# Patient Record
Sex: Male | Born: 1985 | Race: White | Hispanic: No | Marital: Single | Smoking: Never smoker
Health system: Southern US, Community
[De-identification: ages and names within clinical notes are randomized; demographics above are authoritative.]

## PROBLEM LIST (undated history)

## (undated) DIAGNOSIS — Z8719 Personal history of other diseases of the digestive system: Secondary | ICD-10-CM

## (undated) DIAGNOSIS — K5190 Ulcerative colitis, unspecified, without complications: Secondary | ICD-10-CM

## (undated) DIAGNOSIS — K602 Anal fissure, unspecified: Secondary | ICD-10-CM

## (undated) DIAGNOSIS — E119 Type 2 diabetes mellitus without complications: Secondary | ICD-10-CM

## (undated) DIAGNOSIS — F79 Unspecified intellectual disabilities: Secondary | ICD-10-CM

## (undated) DIAGNOSIS — G40909 Epilepsy, unspecified, not intractable, without status epilepticus: Secondary | ICD-10-CM

## (undated) DIAGNOSIS — G809 Cerebral palsy, unspecified: Secondary | ICD-10-CM

## (undated) DIAGNOSIS — K649 Unspecified hemorrhoids: Secondary | ICD-10-CM

## (undated) DIAGNOSIS — R51 Headache: Secondary | ICD-10-CM

## (undated) HISTORY — DX: Unspecified hemorrhoids: K64.9

## (undated) HISTORY — DX: Anal fissure, unspecified: K60.2

## (undated) HISTORY — DX: Personal history of other diseases of the digestive system: Z87.19

## (undated) HISTORY — PX: APPENDECTOMY: SHX54

## (undated) HISTORY — DX: Epilepsy, unspecified, not intractable, without status epilepticus: G40.909

## (undated) HISTORY — DX: Cerebral palsy, unspecified: G80.9

## (undated) HISTORY — DX: Type 2 diabetes mellitus without complications: E11.9

## (undated) HISTORY — DX: Unspecified intellectual disabilities: F79

## (undated) HISTORY — PX: TYMPANOSTOMY TUBE PLACEMENT: SHX32

## (undated) SURGICAL SUPPLY — 20 items
BLOCK BITE 60FR ADLT L/F BLUE (MISCELLANEOUS) ×2
ELECT REM PT RETURN 9FT ADLT (ELECTROSURGICAL)
FCP BXJMBJMB 240X2.8X (CUTTING FORCEPS)
FLOOR PAD 36X40 (MISCELLANEOUS) ×3
FORCEP RJ3 GP 1.8X160 W-NEEDLE (CUTTING FORCEPS)
FORCEPS BIOP RAD 4 LRG CAP 4 (CUTTING FORCEPS)
FORCEPS BIOP RJ4 240 W/NDL (CUTTING FORCEPS)
INJECTOR/SNARE I SNARE (MISCELLANEOUS)
LUBRICANT JELLY 4.5OZ STERILE (MISCELLANEOUS)
MANIFOLD NEPTUNE II (INSTRUMENTS)
NEEDLE SCLEROTHERAPY 25GX240 (NEEDLE)
PROBE APC STR FIRE (PROBE)
PROBE INJECTION GOLD (MISCELLANEOUS)
SNARE ROTATE MED OVAL 20MM (MISCELLANEOUS)
SNARE SHORT THROW 13M SML OVAL (MISCELLANEOUS)
SYR 50ML LL SCALE MARK (SYRINGE)
TRAP SPECIMEN MUCOUS 40CC (MISCELLANEOUS)
TUBING ENDO SMARTCAP PENTAX (MISCELLANEOUS) ×4
TUBING IRRIGATION ENDOGATOR (MISCELLANEOUS) ×2
WATER STERILE IRR 1000ML POUR (IV SOLUTION)

---

## 2011-03-01 VITALS — BP 118/80 | HR 76 | Temp 98.2°F | Resp 16 | Ht 71.0 in | Wt 163.4 lb

## 2011-03-01 DIAGNOSIS — K602 Anal fissure, unspecified: Secondary | ICD-10-CM

## 2011-03-01 MED ORDER — NONFORMULARY OR COMPOUNDED ITEM: Status: DC

## 2011-04-11 VITALS — BP 108/78 | HR 76 | Temp 97.2°F | Resp 16 | Ht 71.0 in | Wt 164.6 lb

## 2011-04-11 DIAGNOSIS — K602 Anal fissure, unspecified: Secondary | ICD-10-CM

## 2011-04-11 MED ORDER — NONFORMULARY OR COMPOUNDED ITEM: Status: DC

## 2011-05-09 VITALS — BP 116/60 | HR 58 | Temp 97.8°F | Ht 70.0 in | Wt 166.8 lb

## 2011-05-09 DIAGNOSIS — K602 Anal fissure, unspecified: Secondary | ICD-10-CM

## 2012-05-14 DIAGNOSIS — H4600 Optic papillitis, unspecified eye: Secondary | ICD-10-CM

## 2012-05-14 DIAGNOSIS — H547 Unspecified visual loss: Secondary | ICD-10-CM

## 2012-05-19 DIAGNOSIS — H4600 Optic papillitis, unspecified eye: Secondary | ICD-10-CM

## 2012-05-19 DIAGNOSIS — H547 Unspecified visual loss: Secondary | ICD-10-CM

## 2012-05-20 DIAGNOSIS — H469 Unspecified optic neuritis: Secondary | ICD-10-CM

## 2012-05-20 DIAGNOSIS — R202 Paresthesia of skin: Secondary | ICD-10-CM

## 2012-05-20 DIAGNOSIS — R200 Anesthesia of skin: Secondary | ICD-10-CM

## 2012-05-21 HISTORY — DX: Headache: R51

## 2012-05-22 DIAGNOSIS — H469 Unspecified optic neuritis: Secondary | ICD-10-CM | POA: Insufficient documentation

## 2012-05-22 DIAGNOSIS — J3489 Other specified disorders of nose and nasal sinuses: Secondary | ICD-10-CM | POA: Insufficient documentation

## 2012-05-22 DIAGNOSIS — I6990 Unspecified sequelae of unspecified cerebrovascular disease: Secondary | ICD-10-CM | POA: Insufficient documentation

## 2012-05-22 DIAGNOSIS — R209 Unspecified disturbances of skin sensation: Secondary | ICD-10-CM | POA: Insufficient documentation

## 2012-05-22 DIAGNOSIS — R569 Unspecified convulsions: Secondary | ICD-10-CM | POA: Insufficient documentation

## 2012-05-22 DIAGNOSIS — R200 Anesthesia of skin: Secondary | ICD-10-CM

## 2012-05-22 DIAGNOSIS — R202 Paresthesia of skin: Secondary | ICD-10-CM

## 2012-05-22 HISTORY — PX: RADIOLOGY WITH ANESTHESIA: SHX6223

## 2012-05-22 MED ORDER — LACTATED RINGERS IV SOLN
INTRAVENOUS | Status: DC | PRN
  Administered 2012-05-22: 15:00:00 via INTRAVENOUS

## 2012-05-22 MED ORDER — LACTATED RINGERS IV SOLN
INTRAVENOUS | Status: DC
  Administered 2012-05-22: 12:00:00 via INTRAVENOUS

## 2012-05-22 MED ORDER — GADOBENATE DIMEGLUMINE 529 MG/ML IV SOLN
20.0000 mL | Freq: Once | INTRAVENOUS | Status: AC
  Administered 2012-05-22: 17 mL via INTRAVENOUS

## 2012-05-28 DIAGNOSIS — H469 Unspecified optic neuritis: Secondary | ICD-10-CM | POA: Insufficient documentation

## 2012-05-28 MED ORDER — SODIUM CHLORIDE 0.9 % IV SOLN: INTRAVENOUS | Status: DC

## 2012-05-28 MED ORDER — SODIUM CHLORIDE 0.9 % IV SOLN
1000.0000 mg | INTRAVENOUS | Status: DC
  Administered 2012-05-28: 1000 mg via INTRAVENOUS

## 2012-05-28 MED ORDER — FAMOTIDINE 20 MG PO TABS
40.0000 mg | ORAL_TABLET | Freq: Every day | ORAL | Status: DC
  Administered 2012-05-28: 40 mg via ORAL

## 2012-05-28 MED ORDER — METHYLPREDNISOLONE SODIUM SUCC 125 MG IJ SOLR: 1000.0000 mg | INTRAMUSCULAR | Status: DC

## 2012-05-28 MED ORDER — SODIUM CHLORIDE 0.9 % IV SOLN: 1000.0000 mg | INTRAVENOUS | Status: AC

## 2012-05-29 MED ORDER — FAMOTIDINE 20 MG PO TABS
40.0000 mg | ORAL_TABLET | Freq: Every day | ORAL | Status: DC
  Administered 2012-05-29: 40 mg via ORAL

## 2012-05-29 MED ORDER — SODIUM CHLORIDE 0.9 % IV SOLN
1000.0000 mg | INTRAVENOUS | Status: DC
  Administered 2012-05-29: 1000 mg via INTRAVENOUS

## 2012-05-29 MED ORDER — SODIUM CHLORIDE 0.9 % IV SOLN
INTRAVENOUS | Status: DC
  Administered 2012-05-29: 10:00:00 via INTRAVENOUS

## 2012-05-30 MED ORDER — SODIUM CHLORIDE 0.9 % IV SOLN: INTRAVENOUS | Status: DC

## 2012-05-30 MED ORDER — FAMOTIDINE 20 MG PO TABS
40.0000 mg | ORAL_TABLET | Freq: Every day | ORAL | Status: AC
  Administered 2012-05-30: 40 mg via ORAL

## 2012-05-30 MED ORDER — SODIUM CHLORIDE 0.9 % IV SOLN
1000.0000 mg | INTRAVENOUS | Status: AC
  Administered 2012-05-30: 1000 mg via INTRAVENOUS

## 2012-07-18 DIAGNOSIS — H5010 Unspecified exotropia: Secondary | ICD-10-CM | POA: Insufficient documentation

## 2012-07-18 DIAGNOSIS — H53009 Unspecified amblyopia, unspecified eye: Secondary | ICD-10-CM | POA: Insufficient documentation

## 2012-08-05 DIAGNOSIS — M545 Low back pain, unspecified: Secondary | ICD-10-CM | POA: Insufficient documentation

## 2012-08-05 DIAGNOSIS — R209 Unspecified disturbances of skin sensation: Secondary | ICD-10-CM | POA: Insufficient documentation

## 2012-08-05 DIAGNOSIS — IMO0001 Reserved for inherently not codable concepts without codable children: Secondary | ICD-10-CM | POA: Insufficient documentation

## 2012-09-30 DIAGNOSIS — M545 Low back pain, unspecified: Secondary | ICD-10-CM | POA: Insufficient documentation

## 2012-09-30 DIAGNOSIS — R209 Unspecified disturbances of skin sensation: Secondary | ICD-10-CM | POA: Insufficient documentation

## 2012-09-30 DIAGNOSIS — IMO0001 Reserved for inherently not codable concepts without codable children: Secondary | ICD-10-CM | POA: Insufficient documentation

## 2012-10-01 DIAGNOSIS — G40309 Generalized idiopathic epilepsy and epileptic syndromes, not intractable, without status epilepticus: Secondary | ICD-10-CM

## 2012-10-01 MED ORDER — DIVALPROEX SODIUM 125 MG PO DR TAB: DELAYED_RELEASE_TABLET | ORAL | Status: DC

## 2012-10-16 DIAGNOSIS — R51 Headache: Secondary | ICD-10-CM | POA: Insufficient documentation

## 2012-10-16 DIAGNOSIS — H469 Unspecified optic neuritis: Secondary | ICD-10-CM | POA: Insufficient documentation

## 2012-10-16 DIAGNOSIS — R209 Unspecified disturbances of skin sensation: Secondary | ICD-10-CM | POA: Insufficient documentation

## 2012-10-16 DIAGNOSIS — M545 Low back pain: Secondary | ICD-10-CM

## 2012-10-16 DIAGNOSIS — R519 Headache, unspecified: Secondary | ICD-10-CM | POA: Insufficient documentation

## 2012-10-16 DIAGNOSIS — F70 Mild intellectual disabilities: Secondary | ICD-10-CM

## 2012-10-16 DIAGNOSIS — G40309 Generalized idiopathic epilepsy and epileptic syndromes, not intractable, without status epilepticus: Secondary | ICD-10-CM | POA: Insufficient documentation

## 2012-10-21 VITALS — BP 110/70 | HR 72 | Ht 69.75 in | Wt 180.2 lb

## 2012-10-21 DIAGNOSIS — M545 Low back pain, unspecified: Secondary | ICD-10-CM

## 2012-10-21 DIAGNOSIS — H469 Unspecified optic neuritis: Secondary | ICD-10-CM

## 2012-10-21 DIAGNOSIS — G40309 Generalized idiopathic epilepsy and epileptic syndromes, not intractable, without status epilepticus: Secondary | ICD-10-CM

## 2012-10-21 DIAGNOSIS — F341 Dysthymic disorder: Secondary | ICD-10-CM

## 2012-10-21 DIAGNOSIS — F70 Mild intellectual disabilities: Secondary | ICD-10-CM

## 2012-10-21 MED ORDER — DEPAKOTE 125 MG PO TBEC: DELAYED_RELEASE_TABLET | ORAL | Status: DC

## 2013-09-04 VITALS — BP 120/68 | HR 84 | Ht 70.0 in | Wt 192.2 lb

## 2013-09-04 DIAGNOSIS — M79609 Pain in unspecified limb: Secondary | ICD-10-CM

## 2013-09-04 DIAGNOSIS — G40309 Generalized idiopathic epilepsy and epileptic syndromes, not intractable, without status epilepticus: Secondary | ICD-10-CM

## 2013-09-04 DIAGNOSIS — M79603 Pain in arm, unspecified: Secondary | ICD-10-CM

## 2013-09-04 DIAGNOSIS — G44219 Episodic tension-type headache, not intractable: Secondary | ICD-10-CM

## 2013-09-04 DIAGNOSIS — F70 Mild intellectual disabilities: Secondary | ICD-10-CM

## 2013-09-04 DIAGNOSIS — M79606 Pain in leg, unspecified: Secondary | ICD-10-CM | POA: Insufficient documentation

## 2013-09-04 MED ORDER — CYMBALTA 20 MG PO CPEP: 20.0000 mg | ORAL_CAPSULE | Freq: Every day | ORAL | Status: DC

## 2014-02-09 VITALS — Ht 70.0 in | Wt 190.0 lb

## 2014-02-09 DIAGNOSIS — G44219 Episodic tension-type headache, not intractable: Secondary | ICD-10-CM

## 2014-02-09 DIAGNOSIS — F70 Mild intellectual disabilities: Secondary | ICD-10-CM

## 2014-02-09 DIAGNOSIS — G40309 Generalized idiopathic epilepsy and epileptic syndromes, not intractable, without status epilepticus: Secondary | ICD-10-CM

## 2014-02-09 DIAGNOSIS — F4323 Adjustment disorder with mixed anxiety and depressed mood: Secondary | ICD-10-CM | POA: Insufficient documentation

## 2014-02-15 DIAGNOSIS — M25571 Pain in right ankle and joints of right foot: Secondary | ICD-10-CM | POA: Diagnosis not present

## 2014-02-15 DIAGNOSIS — S93402D Sprain of unspecified ligament of left ankle, subsequent encounter: Secondary | ICD-10-CM | POA: Insufficient documentation

## 2014-02-15 DIAGNOSIS — Z5189 Encounter for other specified aftercare: Secondary | ICD-10-CM | POA: Insufficient documentation

## 2014-10-22 VITALS — Ht 70.0 in | Wt 175.6 lb

## 2014-10-22 DIAGNOSIS — E118 Type 2 diabetes mellitus with unspecified complications: Secondary | ICD-10-CM | POA: Diagnosis not present

## 2014-10-22 DIAGNOSIS — E1165 Type 2 diabetes mellitus with hyperglycemia: Secondary | ICD-10-CM

## 2014-10-22 DIAGNOSIS — IMO0002 Reserved for concepts with insufficient information to code with codable children: Secondary | ICD-10-CM

## 2014-11-15 DIAGNOSIS — Z0279 Encounter for issue of other medical certificate: Secondary | ICD-10-CM

## 2016-08-17 DIAGNOSIS — R300 Dysuria: Secondary | ICD-10-CM | POA: Diagnosis not present

## 2016-08-17 DIAGNOSIS — B3742 Candidal balanitis: Secondary | ICD-10-CM | POA: Diagnosis not present

## 2016-08-17 DIAGNOSIS — E119 Type 2 diabetes mellitus without complications: Secondary | ICD-10-CM | POA: Diagnosis not present

## 2016-08-17 DIAGNOSIS — T887XXA Unspecified adverse effect of drug or medicament, initial encounter: Secondary | ICD-10-CM | POA: Diagnosis not present

## 2016-08-17 DIAGNOSIS — R197 Diarrhea, unspecified: Secondary | ICD-10-CM | POA: Diagnosis not present

## 2016-08-24 DIAGNOSIS — Z7722 Contact with and (suspected) exposure to environmental tobacco smoke (acute) (chronic): Secondary | ICD-10-CM | POA: Diagnosis not present

## 2016-08-24 DIAGNOSIS — R109 Unspecified abdominal pain: Secondary | ICD-10-CM | POA: Insufficient documentation

## 2016-08-24 DIAGNOSIS — E860 Dehydration: Secondary | ICD-10-CM | POA: Insufficient documentation

## 2016-08-24 DIAGNOSIS — E119 Type 2 diabetes mellitus without complications: Secondary | ICD-10-CM | POA: Insufficient documentation

## 2016-08-24 DIAGNOSIS — R319 Hematuria, unspecified: Secondary | ICD-10-CM | POA: Insufficient documentation

## 2016-08-24 DIAGNOSIS — Z79899 Other long term (current) drug therapy: Secondary | ICD-10-CM | POA: Insufficient documentation

## 2016-08-24 DIAGNOSIS — R197 Diarrhea, unspecified: Secondary | ICD-10-CM | POA: Diagnosis not present

## 2016-08-24 DIAGNOSIS — G809 Cerebral palsy, unspecified: Secondary | ICD-10-CM | POA: Diagnosis not present

## 2016-08-24 MED ORDER — IOPAMIDOL (ISOVUE-300) INJECTION 61%
INTRAVENOUS | Status: AC
  Administered 2016-08-24: 100 mL

## 2016-08-24 MED ORDER — SODIUM CHLORIDE 0.9 % IV BOLUS (SEPSIS)
1000.0000 mL | Freq: Once | INTRAVENOUS | Status: AC
  Administered 2016-08-24: 1000 mL via INTRAVENOUS

## 2016-09-26 DIAGNOSIS — F319 Bipolar disorder, unspecified: Secondary | ICD-10-CM | POA: Diagnosis not present

## 2016-10-01 DIAGNOSIS — R197 Diarrhea, unspecified: Secondary | ICD-10-CM | POA: Diagnosis not present

## 2016-10-01 DIAGNOSIS — R634 Abnormal weight loss: Secondary | ICD-10-CM | POA: Diagnosis not present

## 2016-10-03 DIAGNOSIS — R1084 Generalized abdominal pain: Secondary | ICD-10-CM | POA: Diagnosis not present

## 2016-10-03 DIAGNOSIS — K600 Acute anal fissure: Secondary | ICD-10-CM | POA: Diagnosis not present

## 2016-10-03 DIAGNOSIS — R634 Abnormal weight loss: Secondary | ICD-10-CM | POA: Diagnosis not present

## 2016-10-03 DIAGNOSIS — R197 Diarrhea, unspecified: Secondary | ICD-10-CM | POA: Diagnosis not present

## 2016-10-10 DIAGNOSIS — Z7722 Contact with and (suspected) exposure to environmental tobacco smoke (acute) (chronic): Secondary | ICD-10-CM | POA: Insufficient documentation

## 2016-10-10 DIAGNOSIS — R634 Abnormal weight loss: Secondary | ICD-10-CM | POA: Diagnosis not present

## 2016-10-10 DIAGNOSIS — Z79899 Other long term (current) drug therapy: Secondary | ICD-10-CM | POA: Diagnosis not present

## 2016-10-10 DIAGNOSIS — E119 Type 2 diabetes mellitus without complications: Secondary | ICD-10-CM | POA: Insufficient documentation

## 2016-10-10 DIAGNOSIS — F909 Attention-deficit hyperactivity disorder, unspecified type: Secondary | ICD-10-CM | POA: Diagnosis not present

## 2016-10-10 DIAGNOSIS — R000 Tachycardia, unspecified: Secondary | ICD-10-CM | POA: Diagnosis not present

## 2016-10-10 DIAGNOSIS — E860 Dehydration: Secondary | ICD-10-CM | POA: Diagnosis not present

## 2016-10-10 MED ORDER — SODIUM CHLORIDE 0.9 % IV BOLUS (SEPSIS): 500.0000 mL | Freq: Once | INTRAVENOUS | Status: DC

## 2016-10-10 MED ORDER — SODIUM CHLORIDE 0.9 % IV BOLUS (SEPSIS)
1000.0000 mL | Freq: Once | INTRAVENOUS | Status: AC
  Administered 2016-10-10: 1000 mL via INTRAVENOUS

## 2016-10-19 DIAGNOSIS — G809 Cerebral palsy, unspecified: Secondary | ICD-10-CM | POA: Diagnosis not present

## 2016-10-19 DIAGNOSIS — Z880 Allergy status to penicillin: Secondary | ICD-10-CM | POA: Insufficient documentation

## 2016-10-19 DIAGNOSIS — Z6825 Body mass index (BMI) 25.0-25.9, adult: Secondary | ICD-10-CM | POA: Insufficient documentation

## 2016-10-19 DIAGNOSIS — R197 Diarrhea, unspecified: Secondary | ICD-10-CM | POA: Diagnosis not present

## 2016-10-19 DIAGNOSIS — F79 Unspecified intellectual disabilities: Secondary | ICD-10-CM | POA: Diagnosis not present

## 2016-10-19 DIAGNOSIS — K3189 Other diseases of stomach and duodenum: Secondary | ICD-10-CM | POA: Diagnosis not present

## 2016-10-19 DIAGNOSIS — F988 Other specified behavioral and emotional disorders with onset usually occurring in childhood and adolescence: Secondary | ICD-10-CM | POA: Insufficient documentation

## 2016-10-19 DIAGNOSIS — K5150 Left sided colitis without complications: Secondary | ICD-10-CM | POA: Diagnosis not present

## 2016-10-19 DIAGNOSIS — E119 Type 2 diabetes mellitus without complications: Secondary | ICD-10-CM | POA: Diagnosis not present

## 2016-10-19 DIAGNOSIS — G40909 Epilepsy, unspecified, not intractable, without status epilepticus: Secondary | ICD-10-CM | POA: Diagnosis not present

## 2016-10-19 DIAGNOSIS — Z801 Family history of malignant neoplasm of trachea, bronchus and lung: Secondary | ICD-10-CM | POA: Diagnosis not present

## 2016-10-19 DIAGNOSIS — R634 Abnormal weight loss: Secondary | ICD-10-CM | POA: Insufficient documentation

## 2016-10-19 DIAGNOSIS — Z8052 Family history of malignant neoplasm of bladder: Secondary | ICD-10-CM | POA: Insufficient documentation

## 2016-10-19 DIAGNOSIS — Z8249 Family history of ischemic heart disease and other diseases of the circulatory system: Secondary | ICD-10-CM | POA: Insufficient documentation

## 2016-10-19 DIAGNOSIS — R109 Unspecified abdominal pain: Secondary | ICD-10-CM | POA: Diagnosis not present

## 2016-10-19 DIAGNOSIS — R51 Headache: Secondary | ICD-10-CM | POA: Insufficient documentation

## 2016-10-19 DIAGNOSIS — K529 Noninfective gastroenteritis and colitis, unspecified: Secondary | ICD-10-CM | POA: Diagnosis not present

## 2016-10-19 DIAGNOSIS — K602 Anal fissure, unspecified: Secondary | ICD-10-CM | POA: Diagnosis not present

## 2016-10-19 HISTORY — PX: COLONOSCOPY WITH PROPOFOL: SHX5780

## 2016-10-19 HISTORY — PX: ESOPHAGOGASTRODUODENOSCOPY (EGD) WITH PROPOFOL: SHX5813

## 2016-10-19 MED ORDER — LACTATED RINGERS IV SOLN
INTRAVENOUS | Status: DC | PRN
  Administered 2016-10-19: 11:00:00 via INTRAVENOUS

## 2016-10-19 MED ORDER — PROPOFOL 500 MG/50ML IV EMUL
INTRAVENOUS | Status: DC | PRN
  Administered 2016-10-19: 100 ug/kg/min via INTRAVENOUS

## 2016-10-19 MED ORDER — SODIUM CHLORIDE 0.9 % IV SOLN: INTRAVENOUS | Status: DC

## 2016-10-19 MED ORDER — PROPOFOL 10 MG/ML IV BOLUS: INTRAVENOUS | Status: AC

## 2016-10-19 MED ORDER — ONDANSETRON HCL 4 MG/2ML IJ SOLN
INTRAMUSCULAR | Status: DC | PRN
  Administered 2016-10-19: 4 mg via INTRAVENOUS

## 2016-10-19 MED ORDER — LIDOCAINE 2% (20 MG/ML) 5 ML SYRINGE: INTRAMUSCULAR | Status: AC

## 2016-10-19 MED ORDER — LIDOCAINE 2% (20 MG/ML) 5 ML SYRINGE
INTRAMUSCULAR | Status: DC | PRN
  Administered 2016-10-19: 100 mg via INTRAVENOUS

## 2016-10-19 MED ORDER — ONDANSETRON HCL 4 MG/2ML IJ SOLN: INTRAMUSCULAR | Status: AC

## 2016-10-19 MED ADMIN — PROPOFOL 200 MG/20ML IV EMUL: 20 mg | INTRAVENOUS | NDC 63323026929

## 2016-11-06 DIAGNOSIS — R197 Diarrhea, unspecified: Secondary | ICD-10-CM | POA: Diagnosis not present

## 2016-11-19 DIAGNOSIS — R634 Abnormal weight loss: Secondary | ICD-10-CM | POA: Diagnosis not present

## 2016-11-19 DIAGNOSIS — K5190 Ulcerative colitis, unspecified, without complications: Secondary | ICD-10-CM | POA: Diagnosis not present

## 2016-11-19 DIAGNOSIS — R1084 Generalized abdominal pain: Secondary | ICD-10-CM | POA: Diagnosis not present

## 2016-11-20 DIAGNOSIS — K5190 Ulcerative colitis, unspecified, without complications: Secondary | ICD-10-CM | POA: Insufficient documentation

## 2016-12-17 DIAGNOSIS — E119 Type 2 diabetes mellitus without complications: Secondary | ICD-10-CM | POA: Diagnosis not present

## 2016-12-17 DIAGNOSIS — Z0000 Encounter for general adult medical examination without abnormal findings: Secondary | ICD-10-CM | POA: Diagnosis not present

## 2016-12-17 DIAGNOSIS — E785 Hyperlipidemia, unspecified: Secondary | ICD-10-CM | POA: Diagnosis not present

## 2016-12-17 DIAGNOSIS — Z13228 Encounter for screening for other metabolic disorders: Secondary | ICD-10-CM | POA: Diagnosis not present

## 2016-12-18 DIAGNOSIS — F319 Bipolar disorder, unspecified: Secondary | ICD-10-CM | POA: Diagnosis not present

## 2016-12-31 DIAGNOSIS — K5190 Ulcerative colitis, unspecified, without complications: Secondary | ICD-10-CM | POA: Diagnosis not present

## 2016-12-31 DIAGNOSIS — R634 Abnormal weight loss: Secondary | ICD-10-CM | POA: Diagnosis not present

## 2016-12-31 DIAGNOSIS — R1084 Generalized abdominal pain: Secondary | ICD-10-CM | POA: Diagnosis not present

## 2017-03-21 DIAGNOSIS — F319 Bipolar disorder, unspecified: Secondary | ICD-10-CM | POA: Diagnosis not present

## 2019-02-18 IMAGING — CT CT ABD-PELV W/ CM
2 of 4 series · 15 of 46 positions shown, 17 images · IV contrast (Omni 300)
Comparison: February 22, 2010

CLINICAL DATA: Hematuria and diarrhea

EXAM:
CT ABDOMEN AND PELVIS WITH CONTRAST
TECHNIQUE: Multidetector CT imaging of the abdomen and pelvis was performed
using the standard protocol following bolus administration of
intravenous contrast.
CONTRAST:  100mL [id number] IOPAMIDOL ([id number]) INJECTION 61%

[Series 3: a/p w/ 5mm · axial · 0.68mm/px · z∈[-264,+176]mm · 12 of 102 slices shown, 14 images]
[im 9/102  soft-tissue]
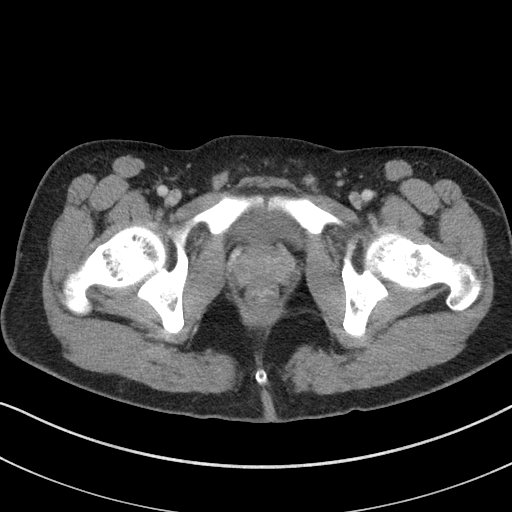
[im 9/102  bone]
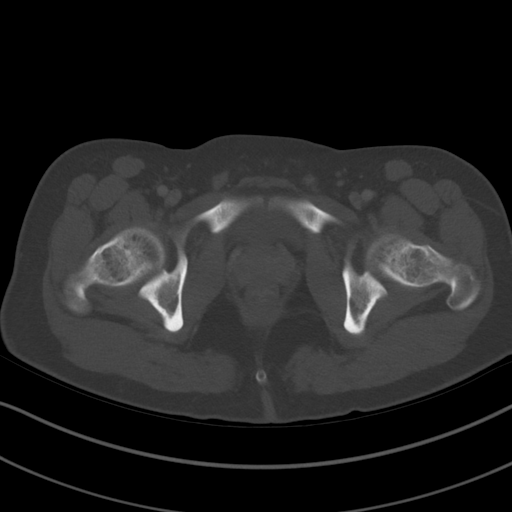
[im 17/102  soft-tissue]
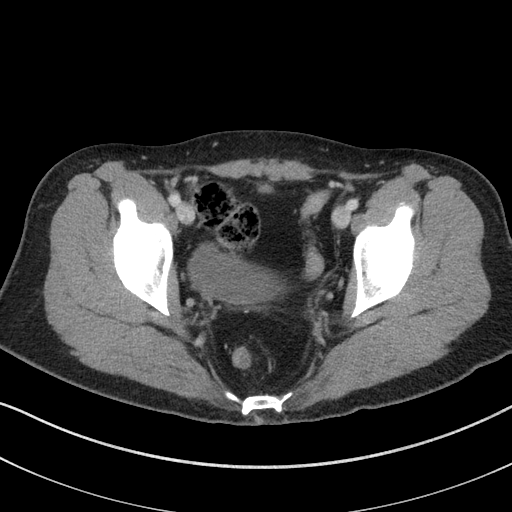
[im 25/102  soft-tissue]
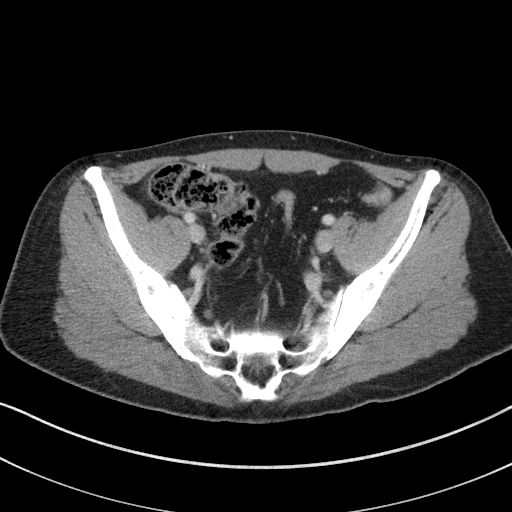
[im 33/102  soft-tissue]
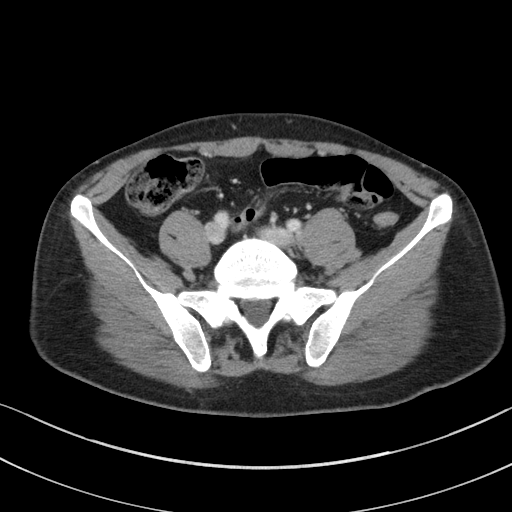
[im 41/102  soft-tissue]
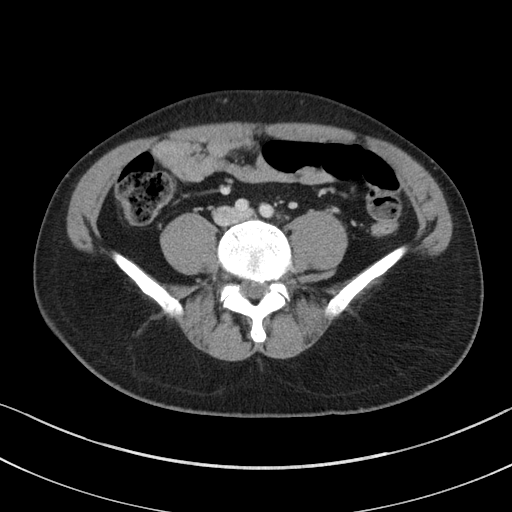
[im 49/102  soft-tissue]
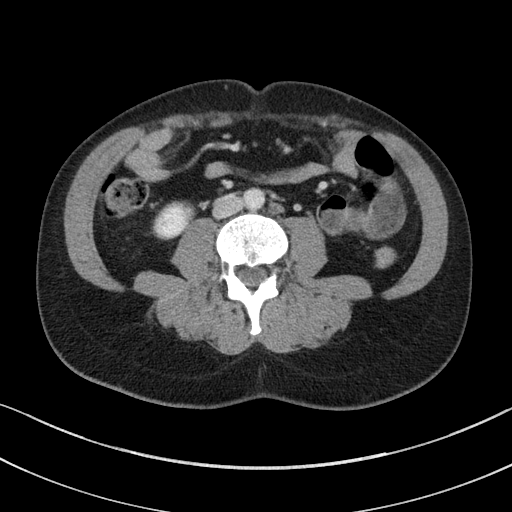
[im 57/102  soft-tissue]
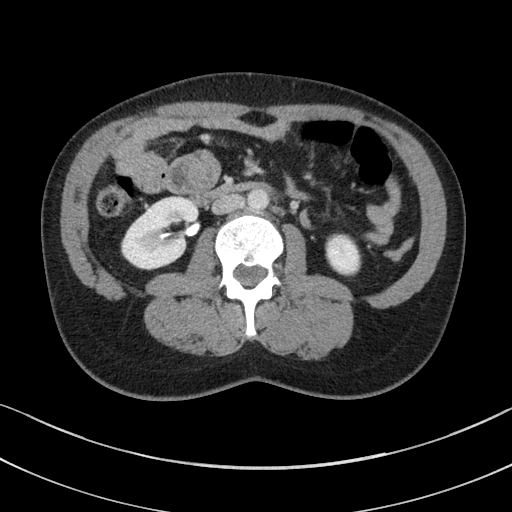
[im 65/102  soft-tissue]
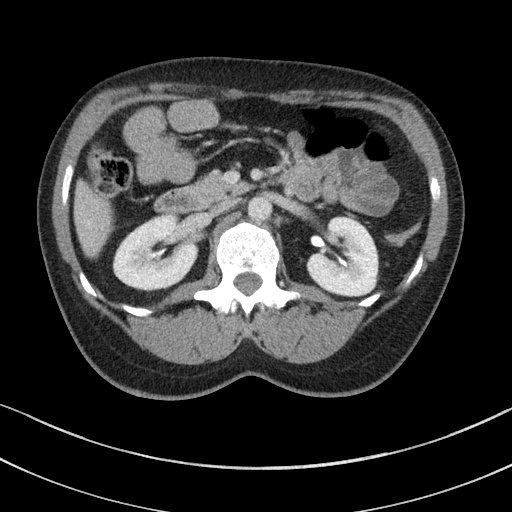
[im 73/102  soft-tissue]
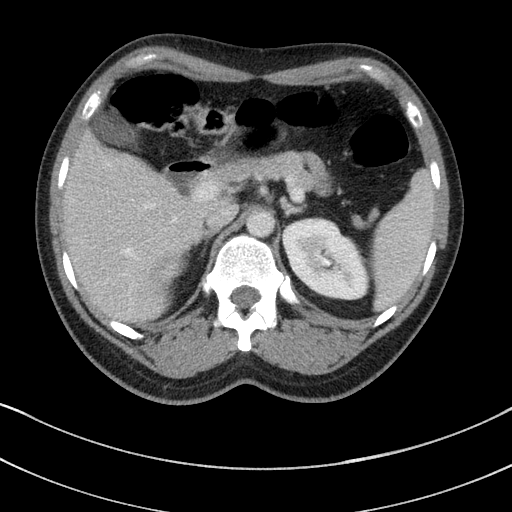
[im 73/102  bone]
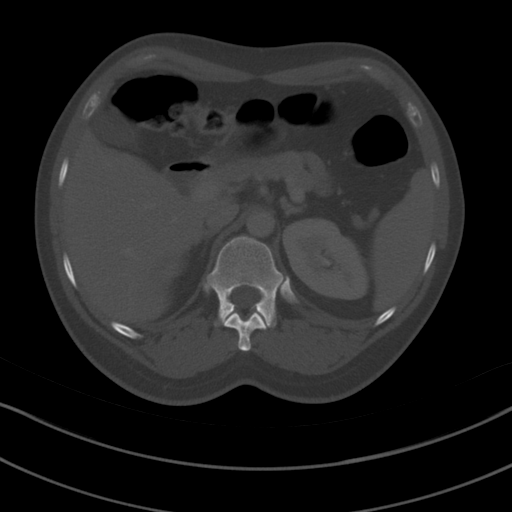
[im 81/102  soft-tissue]
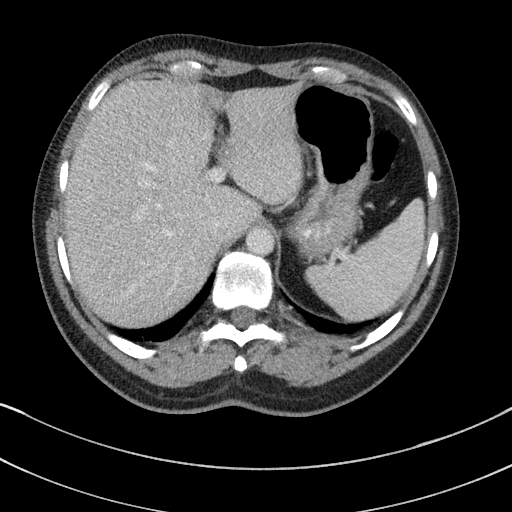
[im 89/102  soft-tissue]
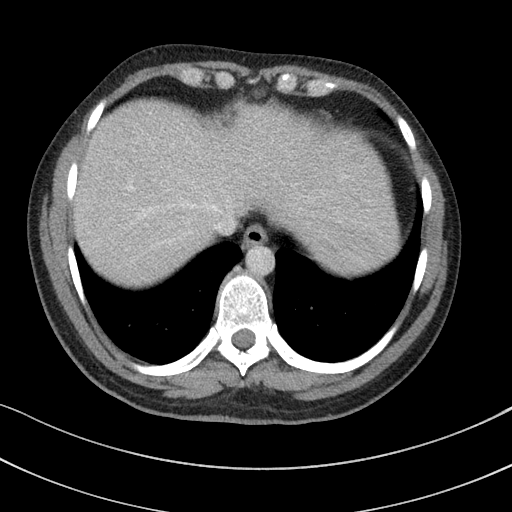
[im 97/102  soft-tissue]
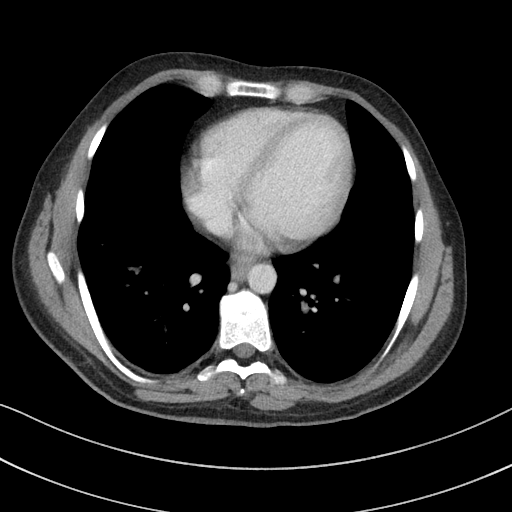

[Series 6: a/p w/ cor · coronal · 0.71mm/px · 3 of 129 slices shown]
[im 43/129  soft-tissue]
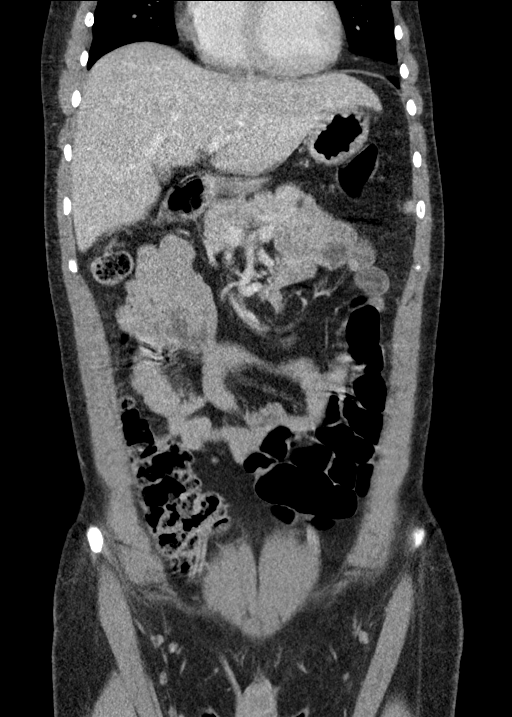
[im 57/129  soft-tissue]
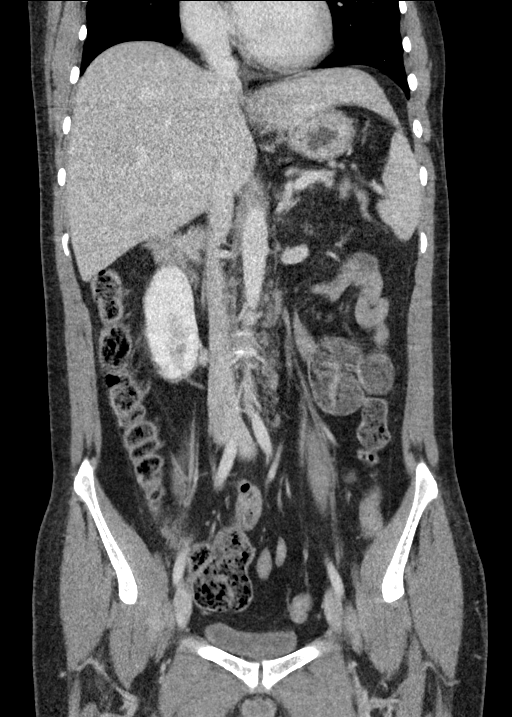
[im 72/129  soft-tissue]
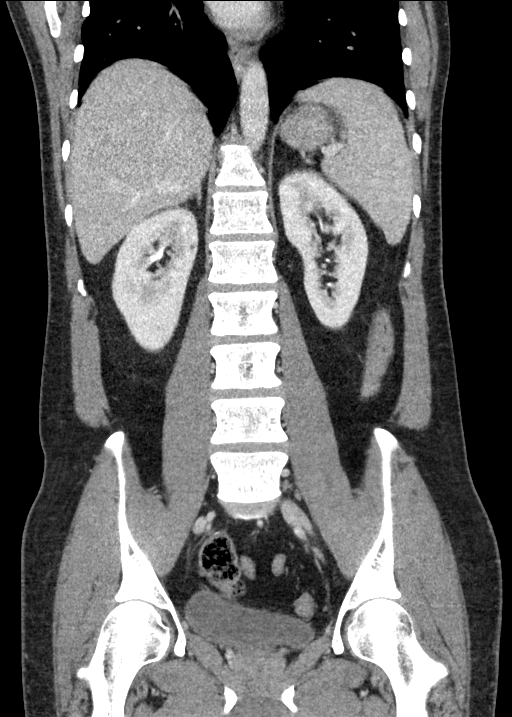

[15 of 46 positions shown; findings below may reference images not displayed]

FINDINGS: Lower chest: There is mild atelectasis in the posterior right base.
Lung bases otherwise are clear.

Hepatobiliary: No focal liver lesions are evident. Gallbladder wall
is not appreciably thickened. There is no biliary duct dilatation.

Pancreas: There is no pancreatic mass or inflammatory focus.

Spleen: No splenic lesions are evident.

Adrenals/Urinary Tract: Adrenals appear normal bilaterally. Kidneys
bilaterally show no evident mass or hydronephrosis. No renal calculi
identified on either side. It must be noted that intravenous
contrast could mask small intrarenal calculi. There is no
appreciable ureteral calculus on either side. Urinary bladder is
midline with wall thickness within normal limits.

Stomach/Bowel: There is no appreciable bowel wall or mesenteric
thickening. No evident bowel obstruction. No free air or portal
venous air.

Vascular/Lymphatic: There is no abdominal aortic aneurysm. No
vascular lesions are evident. There is no appreciable adenopathy in
the abdomen or pelvis.

Reproductive: Prostate and seminal vesicles appear normal. No
evident pelvic mass.

Other: Appendix absent. No ascites or abscess in the abdomen or
pelvis. There is a minimal ventral hernia containing only fat.

Musculoskeletal: There are no evident blastic or lytic bone lesions.
There is no intramuscular or abdominal wall lesion.
IMPRESSION: No hydronephrosis. No renal or ureteral calculus evident.
Intravenous contrast potentially could mask small intrarenal
calculi.

No bowel wall thickening or bowel obstruction. No abscess. Appendix
absent.

Minimal ventral hernia containing only fat.

## 2020-06-16 DIAGNOSIS — E785 Hyperlipidemia, unspecified: Secondary | ICD-10-CM | POA: Insufficient documentation

## 2020-06-16 DIAGNOSIS — Z87828 Personal history of other (healed) physical injury and trauma: Secondary | ICD-10-CM | POA: Insufficient documentation

## 2020-06-16 DIAGNOSIS — L84 Corns and callosities: Secondary | ICD-10-CM

## 2020-06-16 DIAGNOSIS — R634 Abnormal weight loss: Secondary | ICD-10-CM | POA: Insufficient documentation

## 2020-06-16 DIAGNOSIS — R1084 Generalized abdominal pain: Secondary | ICD-10-CM | POA: Insufficient documentation

## 2020-06-16 DIAGNOSIS — M6740 Ganglion, unspecified site: Secondary | ICD-10-CM

## 2020-06-16 DIAGNOSIS — J209 Acute bronchitis, unspecified: Secondary | ICD-10-CM | POA: Insufficient documentation

## 2020-06-16 DIAGNOSIS — M67472 Ganglion, left ankle and foot: Secondary | ICD-10-CM

## 2020-06-16 DIAGNOSIS — R197 Diarrhea, unspecified: Secondary | ICD-10-CM | POA: Insufficient documentation

## 2020-06-16 DIAGNOSIS — K580 Irritable bowel syndrome with diarrhea: Secondary | ICD-10-CM | POA: Insufficient documentation

## 2020-06-16 DIAGNOSIS — G40909 Epilepsy, unspecified, not intractable, without status epilepticus: Secondary | ICD-10-CM | POA: Insufficient documentation

## 2020-06-16 DIAGNOSIS — E119 Type 2 diabetes mellitus without complications: Secondary | ICD-10-CM | POA: Insufficient documentation

## 2020-07-14 DIAGNOSIS — M71372 Other bursal cyst, left ankle and foot: Secondary | ICD-10-CM

## 2020-07-14 DIAGNOSIS — M216X2 Other acquired deformities of left foot: Secondary | ICD-10-CM

## 2020-07-14 DIAGNOSIS — M67472 Ganglion, left ankle and foot: Secondary | ICD-10-CM | POA: Diagnosis not present

## 2020-07-24 DIAGNOSIS — M67472 Ganglion, left ankle and foot: Secondary | ICD-10-CM

## 2020-07-24 DIAGNOSIS — M71372 Other bursal cyst, left ankle and foot: Secondary | ICD-10-CM

## 2020-07-24 DIAGNOSIS — M216X2 Other acquired deformities of left foot: Secondary | ICD-10-CM

## 2020-08-15 DIAGNOSIS — M7672 Peroneal tendinitis, left leg: Secondary | ICD-10-CM | POA: Diagnosis not present

## 2020-08-15 DIAGNOSIS — M216X2 Other acquired deformities of left foot: Secondary | ICD-10-CM | POA: Diagnosis not present

## 2020-08-15 DIAGNOSIS — S91209A Unspecified open wound of unspecified toe(s) with damage to nail, initial encounter: Secondary | ICD-10-CM

## 2020-09-26 DIAGNOSIS — M7672 Peroneal tendinitis, left leg: Secondary | ICD-10-CM | POA: Diagnosis not present

## 2020-09-26 DIAGNOSIS — M216X2 Other acquired deformities of left foot: Secondary | ICD-10-CM | POA: Diagnosis not present

## 2023-05-12 DIAGNOSIS — R42 Dizziness and giddiness: Secondary | ICD-10-CM | POA: Diagnosis present

## 2023-05-12 DIAGNOSIS — J029 Acute pharyngitis, unspecified: Secondary | ICD-10-CM | POA: Diagnosis not present

## 2023-05-12 DIAGNOSIS — K920 Hematemesis: Secondary | ICD-10-CM | POA: Diagnosis not present

## 2023-05-12 DIAGNOSIS — E1165 Type 2 diabetes mellitus with hyperglycemia: Secondary | ICD-10-CM | POA: Insufficient documentation

## 2023-05-12 DIAGNOSIS — Z20822 Contact with and (suspected) exposure to covid-19: Secondary | ICD-10-CM | POA: Diagnosis not present

## 2023-05-12 DIAGNOSIS — R739 Hyperglycemia, unspecified: Secondary | ICD-10-CM

## 2023-05-12 DIAGNOSIS — R059 Cough, unspecified: Secondary | ICD-10-CM | POA: Diagnosis present

## 2023-05-12 MED ADMIN — Sodium Chloride IV Soln 0.9%: 1000 mL | INTRAVENOUS | NDC 00338004904

## 2023-05-16 DIAGNOSIS — E119 Type 2 diabetes mellitus without complications: Secondary | ICD-10-CM | POA: Insufficient documentation

## 2023-05-16 DIAGNOSIS — R1084 Generalized abdominal pain: Secondary | ICD-10-CM | POA: Insufficient documentation

## 2023-05-16 DIAGNOSIS — R630 Anorexia: Secondary | ICD-10-CM | POA: Insufficient documentation

## 2023-05-16 MED ORDER — IOHEXOL 300 MG/ML  SOLN
100.0000 mL | Freq: Once | INTRAMUSCULAR | Status: AC | PRN
  Administered 2023-05-16: 100 mL via INTRAVENOUS

## 2023-05-16 MED ADMIN — Ondansetron Orally Disintegrating Tab 4 MG: 4 mg | ORAL | NDC 16714020030

## 2023-05-16 MED ADMIN — Acetaminophen Tab 500 MG: 1000 mg | ORAL | NDC 50580045711

## 2023-05-17 MED ORDER — ONDANSETRON 4 MG PO TBDP: 4.0000 mg | ORAL_TABLET | Freq: Three times a day (TID) | ORAL | 0 refills | Status: DC | PRN

## 2023-09-09 DIAGNOSIS — J069 Acute upper respiratory infection, unspecified: Secondary | ICD-10-CM | POA: Diagnosis not present

## 2023-09-09 MED ORDER — AZELASTINE HCL 0.1 % NA SOLN: 1.0000 | Freq: Two times a day (BID) | NASAL | 1 refills | Status: AC

## 2023-09-09 MED ORDER — PROMETHAZINE-DM 6.25-15 MG/5ML PO SYRP: 5.0000 mL | ORAL_SOLUTION | Freq: Four times a day (QID) | ORAL | 0 refills | Status: DC | PRN

## 2024-03-07 DIAGNOSIS — R739 Hyperglycemia, unspecified: Secondary | ICD-10-CM

## 2024-03-07 DIAGNOSIS — H538 Other visual disturbances: Secondary | ICD-10-CM

## 2024-03-07 DIAGNOSIS — H5710 Ocular pain, unspecified eye: Secondary | ICD-10-CM | POA: Diagnosis not present

## 2024-03-07 DIAGNOSIS — E1165 Type 2 diabetes mellitus with hyperglycemia: Secondary | ICD-10-CM | POA: Insufficient documentation

## 2024-03-07 DIAGNOSIS — H5711 Ocular pain, right eye: Secondary | ICD-10-CM

## 2024-03-07 DIAGNOSIS — Z79899 Other long term (current) drug therapy: Secondary | ICD-10-CM | POA: Insufficient documentation

## 2024-03-07 DIAGNOSIS — Z794 Long term (current) use of insulin: Secondary | ICD-10-CM | POA: Insufficient documentation

## 2024-03-07 HISTORY — DX: Ulcerative colitis, unspecified, without complications: K51.90

## 2024-03-07 MED ORDER — GADOBUTROL 1 MMOL/ML IV SOLN
7.5000 mL | Freq: Once | INTRAVENOUS | Status: AC | PRN
  Administered 2024-03-07: 7.5 mL via INTRAVENOUS

## 2024-03-07 MED ADMIN — Ketorolac Tromethamine Inj 15 MG/ML: 15 mg | INTRAVENOUS | NDC 72266023401

## 2024-04-21 DIAGNOSIS — H469 Unspecified optic neuritis: Secondary | ICD-10-CM

## 2024-04-21 DIAGNOSIS — E119 Type 2 diabetes mellitus without complications: Secondary | ICD-10-CM | POA: Diagnosis not present

## 2024-04-21 DIAGNOSIS — J302 Other seasonal allergic rhinitis: Secondary | ICD-10-CM | POA: Diagnosis not present

## 2024-04-21 DIAGNOSIS — Z794 Long term (current) use of insulin: Secondary | ICD-10-CM

## 2024-04-21 MED ORDER — ACCU-CHEK SOFTCLIX LANCETS MISC: 12 refills | Status: AC

## 2024-04-21 MED ORDER — INSULIN ASPART 100 UNIT/ML IJ SOLN: 1.0000 [IU] | Freq: Three times a day (TID) | INTRAMUSCULAR | 1 refills | Status: DC

## 2024-04-21 MED ORDER — LORATADINE 10 MG PO TABS: 10.0000 mg | ORAL_TABLET | Freq: Every day | ORAL | 11 refills | Status: AC

## 2024-04-30 ENCOUNTER — Telehealth: Payer: Self-pay | Admitting: Family Medicine

## 2024-04-30 DIAGNOSIS — Z794 Long term (current) use of insulin: Secondary | ICD-10-CM

## 2024-04-30 DIAGNOSIS — E119 Type 2 diabetes mellitus without complications: Secondary | ICD-10-CM

## 2024-04-30 MED ORDER — INSULIN ASPART 100 UNIT/ML IJ SOLN: 1.0000 [IU] | Freq: Three times a day (TID) | INTRAMUSCULAR | 1 refills | Status: AC

## 2024-04-30 NOTE — Telephone Encounter (Signed)
 Mom stopped by office & states the Rx for Novolog  must be specific instead of stating sliding scale must give details.  She states her instructions are if blood sugars are  151-200 = 2 units  201-250 = 4 251-300 = 6 301-350 = 8  Dr. Vita Please verify this is correct I can resend to pharmacy

## 2024-05-05 ENCOUNTER — Encounter: Payer: Self-pay | Admitting: Family Medicine

## 2024-05-05 DIAGNOSIS — K5180 Other ulcerative colitis without complications: Secondary | ICD-10-CM

## 2024-05-05 DIAGNOSIS — E119 Type 2 diabetes mellitus without complications: Secondary | ICD-10-CM

## 2024-05-05 DIAGNOSIS — M5412 Radiculopathy, cervical region: Secondary | ICD-10-CM

## 2024-05-05 MED ORDER — EMPAGLIFLOZIN 25 MG PO TABS: 25.0000 mg | ORAL_TABLET | Freq: Every day | ORAL | 1 refills | Status: AC

## 2024-05-05 MED ORDER — FREESTYLE LIBRE 3 PLUS SENSOR MISC: 2 refills | Status: AC

## 2024-05-05 MED ORDER — FREESTYLE LIBRE 3 READER DEVI: 1.0000 | Freq: Every day | 0 refills | Status: AC

## 2024-05-05 NOTE — Progress Notes (Signed)
" ° °  Name: Devin Fisher.   Date of Visit: 05/05/2024   Date of last visit with me: 04/30/2024   CHIEF COMPLAINT:  Chief Complaint  Patient presents with   Follow-up    2 week follow up on blood sugars, wrist has some pain.        HPI:  Discussed the use of AI scribe software for clinical note transcription with the patient, who gave verbal consent to proceed.  History of Present Illness   Devin Fisher. Devin Fisher is a 39 year old male who presents with wrist pain radiating to the shoulder.  He has been experiencing wrist pain that radiates to the shoulder, with the wrist and hand being the most painful areas. The pain began approximately a week ago without any specific injury or incident.  He has been using over-the-counter medications such as Tylenol  and ibuprofen, but these have not provided significant relief.  He is experiencing issues with blood sugar control, with levels fluctuating and sometimes reaching as high as 300 mg/dL. He is currently on Lantus, which was recently increased to 60 units, and Jardiance , taken once daily. He previously had issues with metformin due to yeast infections.  No pain in other areas besides the wrist and shoulder.         OBJECTIVE:       04/21/2024    9:13 AM  Depression screen PHQ 2/9  Decreased Interest 0  Down, Depressed, Hopeless 0  PHQ - 2 Score 0     BP Readings from Last 3 Encounters:  05/05/24 116/74  04/21/24 120/80  03/07/24 115/77    BP 116/74   Pulse 80   Wt 172 lb 12.8 oz (78.4 kg)   SpO2 98%   BMI 24.44 kg/m    Physical Exam          Physical Exam   Cervical spine:   - Inspection: no gross deformity or asymmetry, swelling or ecchymosis. No skin changes.  - Palpation: No TTP over the spinous processes, paraspinal muscles    - ROM: full active ROM of the cervical spine in flex/ext/SB/rotation without pain    - Provocative Testing: Positive Spurling's Test  ASSESSMENT/PLAN:   Assessment &  Plan Cervical radiculitis  Type 2 diabetes mellitus without complication, with long-term current use of insulin  (HCC)  Other ulcerative colitis without complication (HCC)    Assessment and Plan    Cervical radiculopathy Pain radiating from wrist to shoulder, worsened by neck movement, indicates cervical radiculopathy. - Ordered cervical spine x-rays at [facility]. - Prescribed meloxicam  for 10 days.  Type 2 diabetes mellitus Blood glucose levels remain elevated. Current regimen includes Lantus and Jardiance . Metformin discontinued due to yeast infection. Adjusted Lantus and increased Jardiance  to improve glycemic control. - Adjusted Lantus to 45 units at night and 20 units in the morning. - Increased Jardiance  to 25 mg daily. - Scheduled follow-up appointment for late next week to review x-ray results and assess diabetes management.         Devin Fisher A. Vita MD [facility] "

## 2024-05-05 NOTE — Patient Instructions (Signed)
 Please go get your xrays done at [facility]. You do not need to make an appointment. You can just show up.   Address: [address] [place], [place], [place] [address]
